# Patient Record
Sex: Male | Born: 1960 | Race: White | Hispanic: No | Marital: Married | Smoking: Former smoker
Health system: Southern US, Community
[De-identification: ages and names within clinical notes are randomized; demographics above are authoritative.]

## PROBLEM LIST (undated history)

## (undated) DIAGNOSIS — R112 Nausea with vomiting, unspecified: Secondary | ICD-10-CM

## (undated) DIAGNOSIS — T7840XA Allergy, unspecified, initial encounter: Secondary | ICD-10-CM

## (undated) DIAGNOSIS — Z9889 Other specified postprocedural states: Secondary | ICD-10-CM

## (undated) HISTORY — PX: ANTERIOR CRUCIATE LIGAMENT REPAIR: SHX115

## (undated) HISTORY — DX: Allergy, unspecified, initial encounter: T78.40XA

## (undated) HISTORY — PX: OTHER SURGICAL HISTORY: SHX169

---

## 1983-09-12 HISTORY — PX: OTHER SURGICAL HISTORY: SHX169

## 2005-07-21 ENCOUNTER — Encounter: Admission: RE | Admit: 2005-07-21 | Discharge: 2005-07-21 | Payer: Self-pay | Admitting: Family Medicine

## 2006-06-22 IMAGING — CR DG CHEST 2V
2 series · 2 of 2 positions shown · non-contrast
Comparison: none

CLINICAL DATA: Cough. Crackle at left lung base. 
 CHEST ? TWO VIEWS:

[view not recorded (1 of 2)]
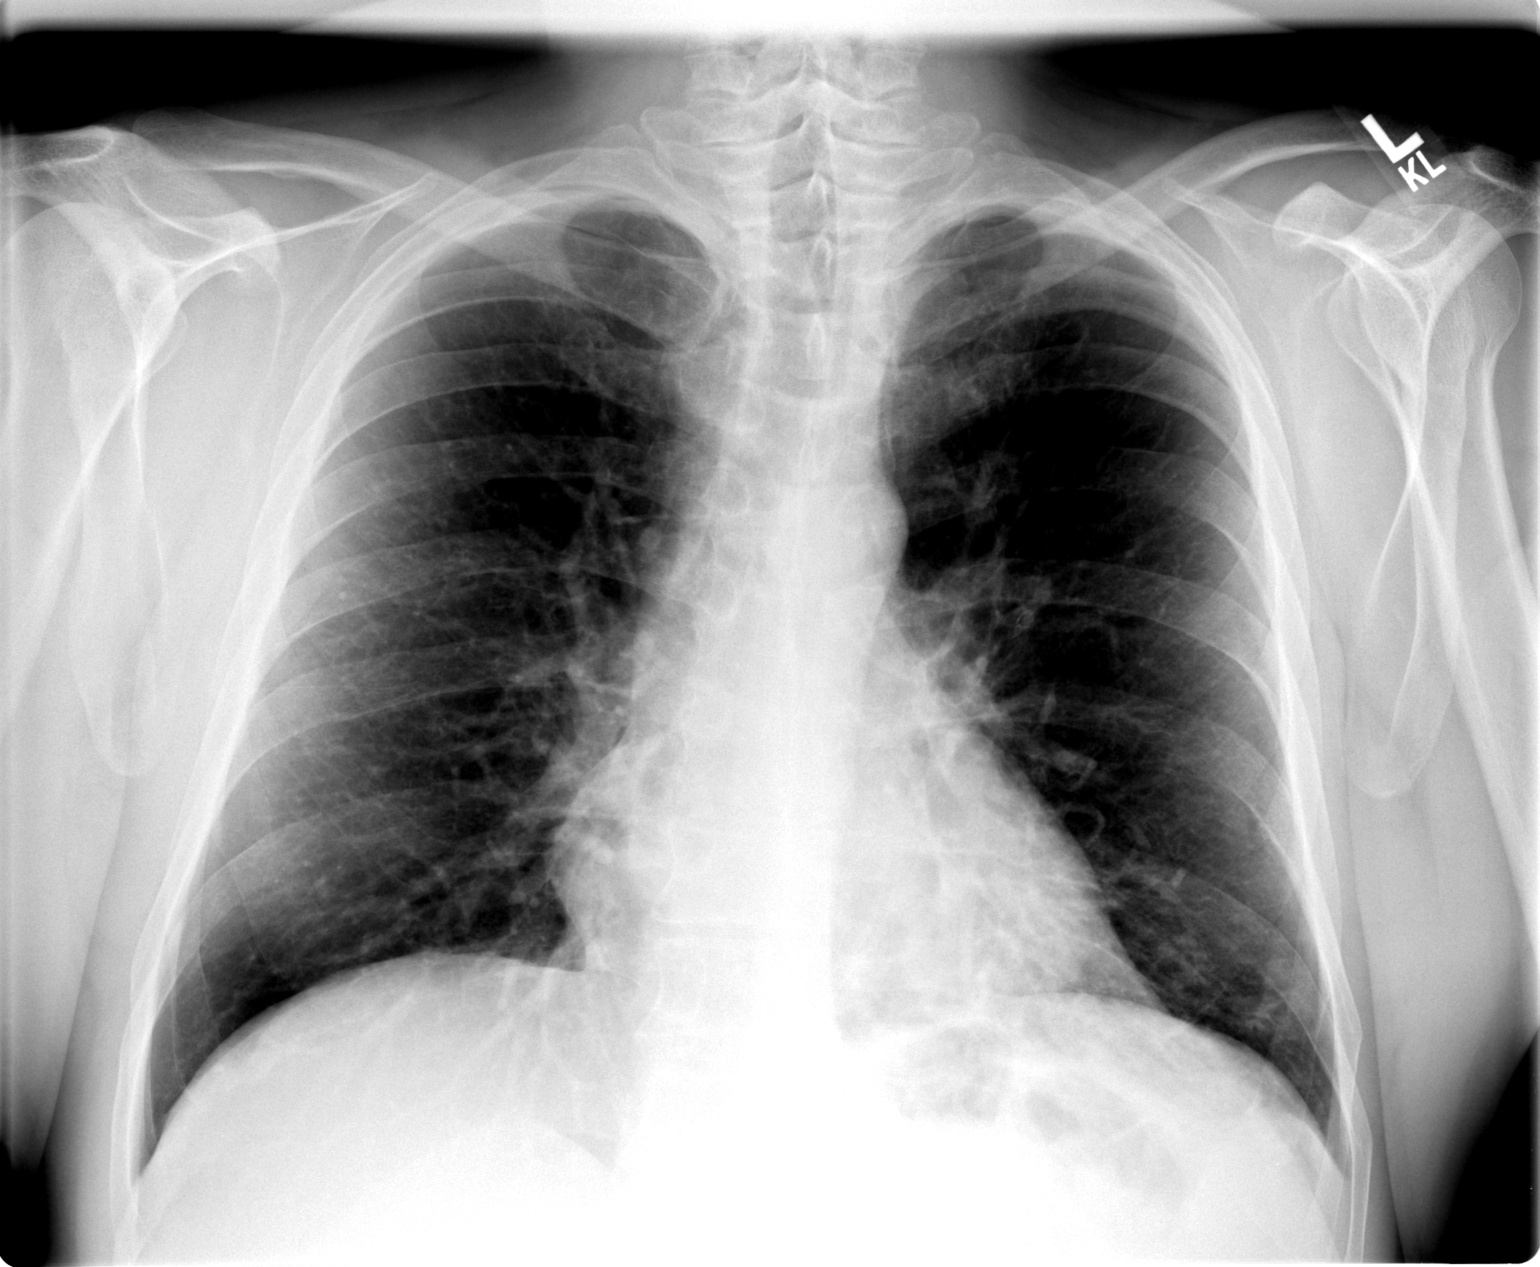

[view not recorded (2 of 2)]
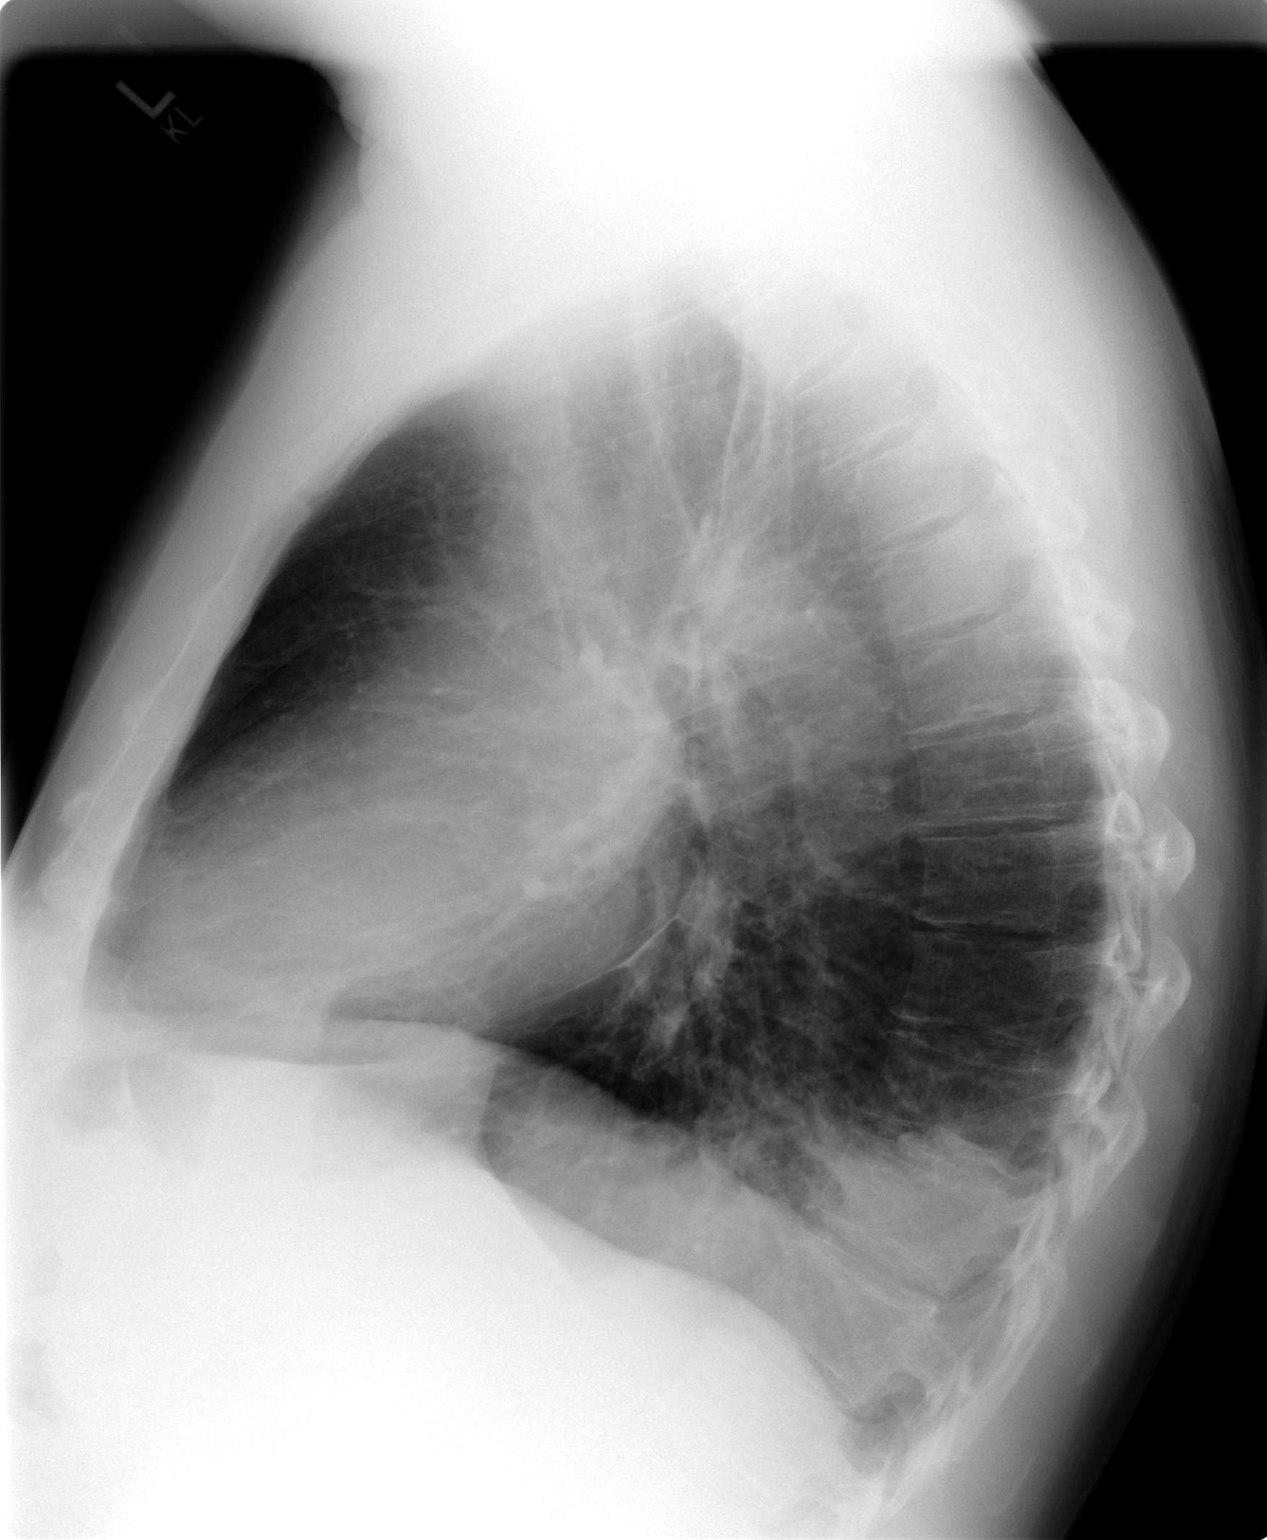

[2 of 2 positions shown; findings below may reference images not displayed]

FINDINGS: Two views of the chest show opacity posteriorly at the left lung base consistent with left lower lobe pneumonia.  The right lung is clear.  The lungs are hyperaerated.  The heart is within upper limits of normal.
IMPRESSION: Left lower lobe opacity consistent with pneumonia.

## 2016-11-21 ENCOUNTER — Encounter: Payer: Self-pay | Admitting: Gastroenterology

## 2017-01-19 ENCOUNTER — Ambulatory Visit (AMBULATORY_SURGERY_CENTER): Payer: Self-pay

## 2017-01-19 ENCOUNTER — Encounter: Payer: Self-pay | Admitting: Gastroenterology

## 2017-01-19 VITALS — Ht 70.0 in | Wt 240.0 lb

## 2017-01-19 DIAGNOSIS — Z1211 Encounter for screening for malignant neoplasm of colon: Secondary | ICD-10-CM

## 2017-01-19 MED ORDER — NA SULFATE-K SULFATE-MG SULF 17.5-3.13-1.6 GM/177ML PO SOLN: 1.0000 | Freq: Once | ORAL | 0 refills | Status: AC

## 2017-01-19 NOTE — Progress Notes (Signed)
Denies allergies to eggs or soy products. Denies complication of anesthesia or sedation. Denies use of weight loss medication. Denies use of O2.   Emmi instructions given for colonoscopy.  

## 2017-02-02 ENCOUNTER — Ambulatory Visit (AMBULATORY_SURGERY_CENTER): Payer: Managed Care, Other (non HMO) | Admitting: Gastroenterology

## 2017-02-02 ENCOUNTER — Encounter: Payer: Self-pay | Admitting: Gastroenterology

## 2017-02-02 VITALS — BP 124/82 | HR 67 | Temp 98.9°F | Resp 17 | Ht 70.0 in | Wt 240.0 lb

## 2017-02-02 DIAGNOSIS — Z1212 Encounter for screening for malignant neoplasm of rectum: Secondary | ICD-10-CM | POA: Diagnosis not present

## 2017-02-02 DIAGNOSIS — K635 Polyp of colon: Secondary | ICD-10-CM

## 2017-02-02 DIAGNOSIS — Z1211 Encounter for screening for malignant neoplasm of colon: Secondary | ICD-10-CM

## 2017-02-02 DIAGNOSIS — D122 Benign neoplasm of ascending colon: Secondary | ICD-10-CM | POA: Diagnosis not present

## 2017-02-02 DIAGNOSIS — D123 Benign neoplasm of transverse colon: Secondary | ICD-10-CM

## 2017-02-02 DIAGNOSIS — D125 Benign neoplasm of sigmoid colon: Secondary | ICD-10-CM | POA: Diagnosis not present

## 2017-02-02 HISTORY — PX: COLONOSCOPY: SHX174

## 2017-02-02 MED ORDER — SODIUM CHLORIDE 0.9 % IV SOLN: 500.0000 mL | INTRAVENOUS | Status: DC

## 2017-02-02 NOTE — Progress Notes (Signed)
Called to room to assist during endoscopic procedure.  Patient ID and intended procedure confirmed with present staff. Received instructions for my participation in the procedure from the performing physician.  

## 2017-02-02 NOTE — Op Note (Signed)
[place] Patient Name: Sean Edwards Procedure Date: 02/02/2017 9:29 AM MRN: [id number] Endoscopist: Ladene Artist , MD Age: 56 Referring MD:  Date of Birth: [birth year] Gender: Male Account #: [id number] Procedure:                Colonoscopy Indications:              Screening for colorectal malignant neoplasm Medicines:                Monitored Anesthesia Care Procedure:                Pre-Anesthesia Assessment:                           - Prior to the procedure, a History and Physical                            was performed, and patient medications and                            allergies were reviewed. The patient's tolerance of                            previous anesthesia was also reviewed. The risks                            and benefits of the procedure and the sedation                            options and risks were discussed with the patient.                            All questions were answered, and informed consent                            was obtained. Prior Anticoagulants: The patient has                            taken no previous anticoagulant or antiplatelet                            agents. ASA Grade Assessment: II - A patient with                            mild systemic disease. After reviewing the risks                            and benefits, the patient was deemed in                            satisfactory condition to undergo the procedure.                           After obtaining informed consent, the colonoscope  was passed under direct vision. Throughout the                            procedure, the patient's blood pressure, pulse, and                            oxygen saturations were monitored continuously. The                            Colonoscope was introduced through the anus and                            advanced to the the cecum, identified by                            appendiceal orifice and  ileocecal valve. The                            ileocecal valve, appendiceal orifice, and rectum                            were photographed. The quality of the bowel                            preparation was good. The colonoscopy was performed                            without difficulty. The patient tolerated the                            procedure well. Scope In: 9:36:26 AM Scope Out: 9:51:49 AM Scope Withdrawal Time: 0 hours 13 minutes 39 seconds  Total Procedure Duration: 0 hours 15 minutes 23 seconds  Findings:                 The perianal and digital rectal examinations were                            normal.                           A 6 mm polyp was found in the sigmoid colon. The                            polyp was sessile. The polyp was removed with a                            cold snare. Resection and retrieval were complete.                           Three sessile polyps were found in the transverse                            colon (1) and ascending colon (2). The polyps were  3 to 4 mm in size. These polyps were removed with a                            cold biopsy forceps. Resection and retrieval were                            complete.                           Internal hemorrhoids were found during                            retroflexion. The hemorrhoids were small and Grade                            I (internal hemorrhoids that do not prolapse).                           The exam was otherwise without abnormality on                            direct and retroflexion views. Complications:            No immediate complications. Estimated blood loss:                            None. Estimated Blood Loss:     Estimated blood loss: none. Impression:               - One 6 mm polyp in the sigmoid colon, removed with                            a cold snare. Resected and retrieved.                           - Three 3 to 4 mm polyps in the  transverse colon                            and in the ascending colon, removed with a cold                            biopsy forceps. Resected and retrieved.                           - Internal hemorrhoids.                           - The examination was otherwise normal on direct                            and retroflexion views. Recommendation:           - Repeat colonoscopy in 5 years for surveillance if                            polyp(s)  are precancerous.                           - Patient has a contact number available for                            emergencies. The signs and symptoms of potential                            delayed complications were discussed with the                            patient. Return to normal activities tomorrow.                            Written discharge instructions were provided to the                            patient.                           - Resume previous diet.                           - Continue present medications.                           - Await pathology results. Ladene Artist, MD 02/02/2017 9:58:23 AM This report has been signed electronically.

## 2017-02-02 NOTE — Progress Notes (Signed)
Pt's states no medical or surgical changes since previsit or office visit. 

## 2017-02-02 NOTE — Patient Instructions (Signed)
YOU HAD AN ENDOSCOPIC PROCEDURE TODAY AT THE [facility]:   Refer to the procedure report that was given to you for any specific questions about what was found during the examination.  If the procedure report does not answer your questions, please call your gastroenterologist to clarify.  If you requested that your care partner not be given the details of your procedure findings, then the procedure report has been included in a sealed envelope for you to review at your convenience later.  YOU SHOULD EXPECT: Some feelings of bloating in the abdomen. Passage of more gas than usual.  Walking can help get rid of the air that was put into your GI tract during the procedure and reduce the bloating. If you had a lower endoscopy (such as a colonoscopy or flexible sigmoidoscopy) you may notice spotting of blood in your stool or on the toilet paper. If you underwent a bowel prep for your procedure, you may not have a normal bowel movement for a few days.  Please Note:  You might notice some irritation and congestion in your nose or some drainage.  This is from the oxygen used during your procedure.  There is no need for concern and it should clear up in a day or so.  SYMPTOMS TO REPORT IMMEDIATELY:   Following lower endoscopy (colonoscopy or flexible sigmoidoscopy):  Excessive amounts of blood in the stool  Significant tenderness or worsening of abdominal pains  Swelling of the abdomen that is new, acute  Fever of 100F or higher  For urgent or emergent issues, a gastroenterologist can be reached at any hour by calling [phone].   DIET:  We do recommend a small meal at first, but then you may proceed to your regular diet.  Drink plenty of fluids but you should avoid alcoholic beverages for 24 hours.  MEDICATIONS: Continue present medications.  Please see handouts given to you by your recovery nurse.  ACTIVITY:  You should plan to take it easy for the rest of today and you should NOT  DRIVE or use heavy machinery until tomorrow (because of the sedation medicines used during the test).    FOLLOW UP: Our staff will call the number listed on your records the next business day following your procedure to check on you and address any questions or concerns that you may have regarding the information given to you following your procedure. If we do not reach you, we will leave a message.  However, if you are feeling well and you are not experiencing any problems, there is no need to return our call.  We will assume that you have returned to your regular daily activities without incident.  If any biopsies were taken you will be contacted by phone or by letter within the next 1-3 weeks.  Please call us at [phone] if you have not heard about the biopsies in 3 weeks.   Thank you for allowing us to provide for your healthcare needs today.   SIGNATURES/CONFIDENTIALITY: You and/or your care partner have signed paperwork which will be entered into your electronic medical record.  These signatures attest to the fact that that the information above on your After Visit Summary has been reviewed and is understood.  Full responsibility of the confidentiality of this discharge information lies with you and/or your care-partner. 

## 2017-02-02 NOTE — Progress Notes (Signed)
Report to PACU, RN, vss, BBS= Clear.  

## 2017-02-06 ENCOUNTER — Telehealth: Payer: Self-pay

## 2017-02-06 NOTE — Telephone Encounter (Signed)
  Follow up Call-  Call back number 02/02/2017  Post procedure Call Back phone  # [phone]  Permission to leave phone message Yes  Some recent data might be hidden     Patient questions:  Do you have a fever, pain , or abdominal swelling? No. Pain Score  0 *  Have you tolerated food without any problems? Yes.    Have you been able to return to your normal activities? Yes.    Do you have any questions about your discharge instructions: Diet   No. Medications  No. Follow up visit  No.  Do you have questions or concerns about your Care? No.  Actions: * If pain score is 4 or above: No action needed, pain <4.

## 2017-02-11 ENCOUNTER — Encounter: Payer: Self-pay | Admitting: Gastroenterology

## 2022-02-17 ENCOUNTER — Encounter: Payer: Self-pay | Admitting: Gastroenterology

## 2022-05-18 ENCOUNTER — Ambulatory Visit: Payer: 59 | Admitting: *Deleted

## 2022-05-18 VITALS — Ht 70.0 in | Wt 215.4 lb

## 2022-05-18 DIAGNOSIS — Z86010 Personal history of colonic polyps: Secondary | ICD-10-CM

## 2022-05-18 MED ORDER — NA SULFATE-K SULFATE-MG SULF 17.5-3.13-1.6 GM/177ML PO SOLN: 1.0000 | Freq: Once | ORAL | 0 refills | Status: AC

## 2022-05-18 NOTE — Progress Notes (Signed)
No egg or soy allergy known to patient  No issues known to pt with past sedation with any surgeries or procedures Patient denies ever being told they had issues or difficulty with intubation  No FH of Malignant Hyperthermia Pt is not on diet pills Pt is not on  home 02  Pt is not on blood thinners  Pt denies issues with constipation  No A fib or A flutter Have any cardiac testing pending--no Pt instructed to use Single[email or web address] or GoodRx for a price reduction on prep   

## 2022-06-15 ENCOUNTER — Ambulatory Visit (AMBULATORY_SURGERY_CENTER): Payer: 59 | Admitting: Gastroenterology

## 2022-06-15 ENCOUNTER — Encounter: Payer: Self-pay | Admitting: Gastroenterology

## 2022-06-15 VITALS — BP 134/85 | HR 72 | Temp 98.2°F | Resp 12 | Ht 70.0 in | Wt 215.4 lb

## 2022-06-15 DIAGNOSIS — Z86010 Personal history of colonic polyps: Secondary | ICD-10-CM

## 2022-06-15 DIAGNOSIS — Z09 Encounter for follow-up examination after completed treatment for conditions other than malignant neoplasm: Secondary | ICD-10-CM

## 2022-06-15 DIAGNOSIS — D124 Benign neoplasm of descending colon: Secondary | ICD-10-CM | POA: Diagnosis not present

## 2022-06-15 MED ORDER — SODIUM CHLORIDE 0.9 % IV SOLN: 500.0000 mL | Freq: Once | INTRAVENOUS | Status: DC

## 2022-06-15 NOTE — Progress Notes (Signed)
Pt's states no medical or surgical changes since previsit or office visit. VS assessed by C.W 

## 2022-06-15 NOTE — Progress Notes (Signed)
A and O x3. Report to RN. Tolerated MAC anesthesia well. 

## 2022-06-15 NOTE — Progress Notes (Signed)
Called to room to assist during endoscopic procedure.  Patient ID and intended procedure confirmed with present staff. Received instructions for my participation in the procedure from the performing physician.  

## 2022-06-15 NOTE — Progress Notes (Signed)
Patient vomited x 3 in recovery.  Gaye Pollack, CRNA gave Zofran.

## 2022-06-15 NOTE — Progress Notes (Signed)
History & Physical  Primary Care Physician:  Pcp, No Primary Gastroenterologist: Lucio Edward, MD  CHIEF COMPLAINT:  Personal history of colon polyps   HPI: Sean Edwards is a 61 y.o. male with a personal history of adenomatous colon polyps for surveillance colonoscopy.   Past Medical History:  Diagnosis Date   Allergy     Past Surgical History:  Procedure Laterality Date   ANTERIOR CRUCIATE LIGAMENT REPAIR Left    COLONOSCOPY  02/02/2017   Lymph node removed  1985   Inflamed lymph node removed.    Tracheotomy due to croup  N/A     Prior to Admission medications   Medication Sig Start Date End Date Taking? Authorizing Provider  cetirizine (ZYRTEC) 10 MG chewable tablet Chew 10 mg by mouth as needed for allergies.   Yes [provider]  fluticasone (FLONASE) 50 MCG/ACT nasal spray Place 1 spray into both nostrils daily.   Yes [provider]  Multiple Vitamins-Minerals (MULTIVITAMIN ADULTS PO) Take by mouth.   Yes [provider]  Multiple Vitamins-Minerals (OCUVITE PO) Take by mouth.   Yes [provider]  naproxen sodium (ANAPROX) 220 MG tablet Take 220 mg by mouth as needed.   Yes [provider]  OVER THE COUNTER MEDICATION Take 1,000 packets by mouth. Emergen-C Packet 2 to 3 times a week. Vitamin supplement.   Yes [provider]  doxylamine, Sleep, (UNISOM) 25 MG tablet Take 25 mg by mouth as needed. Patient not taking: Reported on 06/15/2022    [provider]    Current Outpatient Medications  Medication Sig Dispense Refill   cetirizine (ZYRTEC) 10 MG chewable tablet Chew 10 mg by mouth as needed for allergies.     fluticasone (FLONASE) 50 MCG/ACT nasal spray Place 1 spray into both nostrils daily.     Multiple Vitamins-Minerals (MULTIVITAMIN ADULTS PO) Take by mouth.     Multiple Vitamins-Minerals (OCUVITE PO) Take by mouth.     naproxen sodium (ANAPROX) 220 MG tablet Take 220 mg by mouth as  needed.     OVER THE COUNTER MEDICATION Take 1,000 packets by mouth. Emergen-C Packet 2 to 3 times a week. Vitamin supplement.     doxylamine, Sleep, (UNISOM) 25 MG tablet Take 25 mg by mouth as needed. (Patient not taking: Reported on 06/15/2022)     Current Facility-Administered Medications  Medication Dose Route Frequency Provider Last Rate Last Admin   0.9 %  sodium chloride infusion  500 mL Intravenous Continuous Ladene Artist, MD       0.9 %  sodium chloride infusion  500 mL Intravenous Once Ladene Artist, MD        Allergies as of 06/15/2022   (No Active Allergies)    Family History  Problem Relation Age of Onset   Colon cancer Neg Hx    Esophageal cancer Neg Hx    Rectal cancer Neg Hx    Stomach cancer Neg Hx     Social History   Socioeconomic History   Marital status: Married    Spouse name: Not on file   Number of children: Not on file   Years of education: Not on file   Highest education level: Not on file  Occupational History   Not on file  Tobacco Use   Smoking status: Former   Smokeless tobacco: Former    Types: Chew   Tobacco comments:    Quit over 15 years ago.  Substance and Sexual Activity   Alcohol  use: Yes    Comment: occasional beer   Drug use: No   Sexual activity: Not on file  Other Topics Concern   Not on file  Social History Narrative   Not on file   Social Determinants of Health   Financial Resource Strain: Not on file  Food Insecurity: Not on file  Transportation Needs: Not on file  Physical Activity: Not on file  Stress: Not on file  Social Connections: Not on file  Intimate Partner Violence: Not on file    Review of Systems:  All systems reviewed were negative except where noted in HPI.   Physical Exam: General:  Alert, well-developed, in NAD Head:  Normocephalic and atraumatic. Eyes:  Sclera clear, no icterus.   Conjunctiva pink. Ears:  Normal auditory acuity. Mouth:  No deformity or lesions.  Neck:  Supple; no  masses . Lungs:  Clear throughout to auscultation.   No wheezes, crackles, or rhonchi. No acute distress. Heart:  Regular rate and rhythm; no murmurs. Abdomen:  Soft, nondistended, nontender. No masses, hepatomegaly. No obvious masses.  Normal bowel .    Rectal:  Deferred   Msk:  Symmetrical without gross deformities.. Pulses:  Normal pulses noted. Extremities:  Without edema. Neurologic:  Alert and  oriented x4;  grossly normal neurologically. Skin:  Intact without significant lesions or rashes. Cervical Nodes:  No significant cervical adenopathy. Psych:  Alert and cooperative. Normal mood and affect.   Impression / Edwards:   Personal history of adenomatous colon polyps for surveillance colonoscopy.  Sean Edwards. Sean Edwards  06/15/2022, 8:37 AM See Shea Evans, [facility], to contact our on call provider

## 2022-06-15 NOTE — Op Note (Signed)
[facility] Patient Name: Demetrias Goodbar Procedure Date: 06/15/2022 8:31 AM MRN: [id number] Endoscopist: Ladene Artist , MD Age: 61 Referring MD:  Date of Birth: [birth year] Gender: Male Account #: [id number] Procedure:                Colonoscopy Indications:              Surveillance: Personal history of adenomatous                            polyps on last colonoscopy 5 years ago Medicines:                Monitored Anesthesia Care Procedure:                Pre-Anesthesia Assessment:                           - Prior to the procedure, a History and Physical                            was performed, and patient medications and                            allergies were reviewed. The patient's tolerance of                            previous anesthesia was also reviewed. The risks                            and benefits of the procedure and the sedation                            options and risks were discussed with the patient.                            All questions were answered, and informed consent                            was obtained. Prior Anticoagulants: The patient has                            taken no previous anticoagulant or antiplatelet                            agents. ASA Grade Assessment: II - A patient with                            mild systemic disease. After reviewing the risks                            and benefits, the patient was deemed in                            satisfactory condition to undergo the procedure.  After obtaining informed consent, the colonoscope                            was passed under direct vision. Throughout the                            procedure, the patient's blood pressure, pulse, and                            oxygen saturations were monitored continuously. The                            Olympus CF-HQ190L [id number]) Colonoscope was                            introduced through the anus and  advanced to the the                            cecum, identified by appendiceal orifice and                            ileocecal valve. The ileocecal valve, appendiceal                            orifice, and rectum were photographed. The quality                            of the bowel preparation was good. The colonoscopy                            was performed without difficulty. The patient                            tolerated the procedure well. Scope In: 8:39:24 AM Scope Out: 8:53:35 AM Scope Withdrawal Time: 0 hours 12 minutes 21 seconds  Total Procedure Duration: 0 hours 14 minutes 11 seconds  Findings:                 The perianal and digital rectal examinations were                            normal.                           A 6 mm polyp was found in the descending colon. The                            polyp was sessile. The polyp was removed with a                            cold snare. Resection and retrieval were complete.                           Internal hemorrhoids were found during  retroflexion. The hemorrhoids were small and Grade                            I (internal hemorrhoids that do not prolapse).                           The exam was otherwise without abnormality on                            direct and retroflexion views. Complications:            No immediate complications. Estimated blood loss:                            None. Estimated Blood Loss:     Estimated blood loss: none. Impression:               - One 6 mm polyp in the descending colon, removed                            with a cold snare. Resected and retrieved.                           - Internal hemorrhoids.                           - The examination was otherwise normal on direct                            and retroflexion views. Recommendation:           - Repeat colonoscopy after studies are complete for                            surveillance based on pathology  results.                           - Patient has a contact number available for                            emergencies. The signs and symptoms of potential                            delayed complications were discussed with the                            patient. Return to normal activities tomorrow.                            Written discharge instructions were provided to the                            patient.                           - Resume previous diet.                           -  Continue present medications.                           - Await pathology results. Ladene Artist, MD 06/15/2022 8:56:27 AM This report has been signed electronically.

## 2022-06-15 NOTE — Patient Instructions (Signed)
Information on polyps given to you today.  Await pathology results.  Resume previous diet and medications.    YOU HAD AN ENDOSCOPIC PROCEDURE TODAY AT THE [facility]:   Refer to the procedure report that was given to you for any specific questions about what was found during the examination.  If the procedure report does not answer your questions, please call your gastroenterologist to clarify.  If you requested that your care partner not be given the details of your procedure findings, then the procedure report has been included in a sealed envelope for you to review at your convenience later.  YOU SHOULD EXPECT: Some feelings of bloating in the abdomen. Passage of more gas than usual.  Walking can help get rid of the air that was put into your GI tract during the procedure and reduce the bloating. If you had a lower endoscopy (such as a colonoscopy or flexible sigmoidoscopy) you may notice spotting of blood in your stool or on the toilet paper. If you underwent a bowel prep for your procedure, you may not have a normal bowel movement for a few days.  Please Note:  You might notice some irritation and congestion in your nose or some drainage.  This is from the oxygen used during your procedure.  There is no need for concern and it should clear up in a day or so.  SYMPTOMS TO REPORT IMMEDIATELY:  Following lower endoscopy (colonoscopy or flexible sigmoidoscopy):  Excessive amounts of blood in the stool  Significant tenderness or worsening of abdominal pains  Swelling of the abdomen that is new, acute  Fever of 100F or higher  For urgent or emergent issues, a gastroenterologist can be reached at any hour by calling [phone]. Do not use MyChart messaging for urgent concerns.    DIET:  We do recommend a small meal at first, but then you may proceed to your regular diet.  Drink plenty of fluids but you should avoid alcoholic beverages for 24 hours.  ACTIVITY:  You should  plan to take it easy for the rest of today and you should NOT DRIVE or use heavy machinery until tomorrow (because of the sedation medicines used during the test).    FOLLOW UP: Our staff will call the number listed on your records the next business day following your procedure.  We will call around 7:15- 8:00 am to check on you and address any questions or concerns that you may have regarding the information given to you following your procedure. If we do not reach you, we will leave a message.     If any biopsies were taken you will be contacted by phone or by letter within the next 1-3 weeks.  Please call us at [phone] if you have not heard about the biopsies in 3 weeks.    SIGNATURES/CONFIDENTIALITY: You and/or your care partner have signed paperwork which will be entered into your electronic medical record.  These signatures attest to the fact that that the information above on your After Visit Summary has been reviewed and is understood.  Full responsibility of the confidentiality of this discharge information lies with you and/or your care-partner.  

## 2022-06-16 ENCOUNTER — Telehealth: Payer: Self-pay

## 2022-06-16 NOTE — Telephone Encounter (Signed)
  Follow up Call-     06/15/2022    7:23 AM  Call back number  Post procedure Call Back phone  # [phone]  Permission to leave phone message Yes     Patient questions:  Do you have a fever, pain , or abdominal swelling? No. Pain Score  0 *  Have you tolerated food without any problems? Yes.    Have you been able to return to your normal activities? Yes.    Do you have any questions about your discharge instructions: Diet   No. Medications  No. Follow up visit  No.  Do you have questions or concerns about your Care? No.  Actions: * If pain score is 4 or above: No action needed, pain <4.

## 2022-07-04 ENCOUNTER — Encounter: Payer: Self-pay | Admitting: Gastroenterology

## 2023-04-05 ENCOUNTER — Other Ambulatory Visit: Payer: Self-pay | Admitting: Urology

## 2023-04-05 ENCOUNTER — Encounter (HOSPITAL_BASED_OUTPATIENT_CLINIC_OR_DEPARTMENT_OTHER): Payer: Self-pay | Admitting: Urology

## 2023-04-05 NOTE — Progress Notes (Signed)
   04/05/23 1649  Pre-op Phone Call  Surgery Date Verified 04/06/23  Arrival Time Verified 1145  Surgery Location Verified [facility]  Medical History Reviewed Yes  Is the patient taking a GLP-1 receptor agonist? No  Does the patient have diabetes? No diagnosis of diabetes  Do you have a history of heart problems? No  Does patient have other implanted devices? No  Patient Teaching Pre / Post Procedure  Patient educated about smoking cessation 24 hours prior to surgery. N/A Non-Smoker  Patient verbalizes understanding of bowel prep? Yes  NPO (Including gum & candy) After midnight  Allowed clear liquids Water;Black Coffee Only (no creamer, milk or cream including half and half);Juice (not-citric and without pulp - diabetics please choose diet or no sugar options);Carbonated beverages - (diabetics please choose diet or no sugar options)  Stop Solids, Milk, Candy, and Gum STARTING AT MIDNIGHT  Responsible adult to drive and be with you for 24 hours? Yes  Name & Phone Number for Ride/Caregiver Chyrl Civatte (wife) [phone]  Call this number the morning of surgery  with any problems that may cancel your surgery. [phone]  Covid-19 Assessment  Have you had a positive COVID-19 test within the previous 90 days? No  COVID Testing Guidance Proceed with the additional questions.  Patient's surgery required a COVID-19 test (cardiothoracic, complex ENT, and bronchoscopies/ EBUS) No  Have you been unmasked and in close contact with anyone with COVID-19 or COVID-19 symptoms within the past 10 days? No  Do you or anyone in your household currently have any COVID-19 symptoms? No

## 2023-04-05 NOTE — Progress Notes (Signed)
Spoke with patient concerning arrival time 1145, NPO past MN, clear liquids until 0730. Wife Chyrl Civatte to be caregiver and ride. Pt took Naproxen this am, MD aware and okay with that. Instructed to take no further NSAIDs today.

## 2023-04-05 NOTE — H&P (Signed)
Office Visit Report     04/05/2023   --------------------------------------------------------------------------------   Sean Edwards  MRN: [id number]  DOB: 22-Sep-1960, 62 year old Male  SSN:    PRIMARY CARE:     REFERRING:    PROVIDER:  Jerilee Field, M.D.  LOCATION:  [facility] [phone]     --------------------------------------------------------------------------------   CC/HPI: New pt -   1) right distal stone - flank pain while traveling in [place], [place]. CT 03/27/2023 with a 6 mm right distal stone. UA negative. Cr 1.6. wbc 16. He hasn't seen a stone pass. His pain has improved. Minimal today. Gets RLQ pain. Oxycodone makes him feel agitated and nauseated. Hydrocodone better. Not on tamsulosin.   2) renal stones - he passed a stone around 2017. A CT 03/27/2023 with a few nonobstructing bilateral renal stones measuring up to 5 mm on the right and 6 mm on the left.   He is AFVSS. KUB today - bilateral 6 mm lower pole stones, 6 mm right distal stone at right UVJ.   No dysuria or fever.   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     ALLERGIES: No Allergies    MEDICATIONS: Aleve 1 PO Daily     GU PSH: None   NON-GU PSH: Knee Arthroscopy/surgery - 1999     GU PMH: Renal calculus    NON-GU PMH: None   FAMILY HISTORY: kidney stone - Mother   SOCIAL HISTORY: Marital Status: Married Current Smoking Status: Patient does not smoke anymore. Has not smoked since 03/12/2003. Smoked for 10 years.   Tobacco Use Assessment Completed: Used Tobacco in last 30 days? Drinks 1 drink per week.     REVIEW OF SYSTEMS:    GU Review Male:   Patient reports frequent urination and get up at night to urinate. Patient denies hard to postpone urination, burning/ pain with urination, leakage of urine, stream starts and stops, trouble starting your stream, have to strain to urinate , erection problems, and penile pain.   Gastrointestinal (Upper):   Patient denies nausea, vomiting, and indigestion/ heartburn.  Gastrointestinal (Lower):   Patient denies diarrhea and constipation.  Constitutional:   Patient denies fever, night sweats, weight loss, and fatigue.  Skin:   Patient denies skin rash/ lesion and itching.  Eyes:   Patient denies blurred vision and double vision.  Ears/ Nose/ Throat:   Patient denies sore throat and sinus problems.  Hematologic/Lymphatic:   Patient denies swollen glands and easy bruising.  Cardiovascular:   Patient denies leg swelling and chest pains.  Respiratory:   Patient denies cough and shortness of breath.  Endocrine:   Patient denies excessive thirst.  Musculoskeletal:   Patient denies joint pain and back pain.  Neurological:   Patient reports headaches. Patient denies dizziness.  Psychologic:   Patient denies depression and anxiety.   VITAL SIGNS:      04/05/2023 01:16 PM  Weight 215 lb / 97.52 kg  Height 71 in / 180.34 cm  BP 155/83 mmHg  Pulse 76 /min  Temperature 97.3 F / 36.2 C  BMI 30.0 kg/m   MULTI-SYSTEM PHYSICAL EXAMINATION:    Constitutional: Well-nourished. No physical deformities. Normally developed. Good grooming.  Neck: Neck symmetrical, not swollen. Normal tracheal position.  Respiratory: No labored breathing, no use of accessory muscles.   Cardiovascular: Normal temperature, normal extremity pulses, no swelling, no varicosities.  Lymphatic: No enlargement of neck, axillae, groin.  Skin: No paleness, no jaundice, no cyanosis. No lesion, no ulcer, no rash.  Neurologic / Psychiatric: Oriented to time, oriented to place, oriented to person. No depression, no anxiety, no agitation.  Gastrointestinal: No mass, no tenderness, no rigidity, non obese abdomen.  Eyes: Normal conjunctivae. Normal eyelids.  Ears, Nose, Mouth, and Throat: Left ear no scars, no lesions, no masses. Right ear no scars, no lesions, no masses. Nose no scars, no lesions, no masses. Normal  hearing. Normal lips.  Musculoskeletal: Normal gait and station of head and neck.     Complexity of Data:  Lab Test Review:   BMP, CBC with Diff  Records Review:   Previous Hospital Records  X-Ray Review: C.T. Abdomen/Pelvis: Reviewed Report. Discussed With Patient. 2024   Notes:                     [facility]    PROCEDURES:         KUB - [id number]  A single view of the abdomen is obtained.  Calculi:  bilateral 6 mm lower pole stones, 6 mm right distal stone at right UVJ.       The bones appeared normal. The bowel gas pattern appeared normal. The soft tissues were unremarkable. . Patient confirmed No Neulasta OnPro Device.     ASSESSMENT:      ICD-10 Details  1 GU:   Ureteral calculus - N20.1 Chronic, Stable - I discussed with the patient the nature risks and benefits of continued stone passage, off label use of alpha blockers, shockwave lithotripsy or ureteroscopy. All questions answered. He will proceed with right ESWL.   2   Renal calculus - N20.0 Chronic, Stable   PLAN:           Orders X-Rays: KUB          Schedule Return Visit/Planned Activity: ASAP - Schedule Surgery          Document Letter(s):  Created for Patient: Clinical Summary         Next Appointment:      Next Appointment: 04/06/2023 01:45 PM    Appointment Type: Surgery     Location: [facility] [phone] [id number]    Provider: Jerilee Field, M.D.    Reason for Visit: OP NE RT ESWL      * Signed by Jerilee Field, M.D. on 04/05/23 at 2:49 PM (EDT*      The information contained in this medical record document is considered private and confidential patient information. This information can only be used for the medical diagnosis and/or medical services that are being provided by the patient's selected caregivers. This information can only be distributed outside of the patient's care if the patient agrees and signs waivers of authorization for this information to be sent to an  outside source or route.

## 2023-04-06 ENCOUNTER — Ambulatory Visit (HOSPITAL_COMMUNITY): Payer: 59

## 2023-04-06 ENCOUNTER — Encounter (HOSPITAL_BASED_OUTPATIENT_CLINIC_OR_DEPARTMENT_OTHER)
Admission: RE | Disposition: A | Discharge status: Home / Self Care | Payer: Self-pay | Source: Home / Self Care | Attending: Urology

## 2023-04-06 ENCOUNTER — Other Ambulatory Visit: Payer: Self-pay

## 2023-04-06 ENCOUNTER — Ambulatory Visit (HOSPITAL_BASED_OUTPATIENT_CLINIC_OR_DEPARTMENT_OTHER)
Admission: RE | Admit: 2023-04-06 | Discharge: 2023-04-06 | Disposition: A | Discharge status: Home / Self Care | Payer: 59 | Attending: Urology | Admitting: Urology

## 2023-04-06 ENCOUNTER — Encounter (HOSPITAL_BASED_OUTPATIENT_CLINIC_OR_DEPARTMENT_OTHER): Payer: Self-pay | Admitting: Urology

## 2023-04-06 DIAGNOSIS — Z87891 Personal history of nicotine dependence: Secondary | ICD-10-CM | POA: Insufficient documentation

## 2023-04-06 DIAGNOSIS — N201 Calculus of ureter: Secondary | ICD-10-CM | POA: Diagnosis present

## 2023-04-06 HISTORY — DX: Other specified postprocedural states: Z98.890

## 2023-04-06 HISTORY — DX: Nausea with vomiting, unspecified: R11.2

## 2023-04-06 HISTORY — PX: EXTRACORPOREAL SHOCK WAVE LITHOTRIPSY: SHX1557

## 2023-04-06 SURGERY — LITHOTRIPSY, ESWL
Anesthesia: LOCAL | Laterality: Right

## 2023-04-06 MED ORDER — DIAZEPAM 5 MG PO TABS
10.0000 mg | ORAL_TABLET | ORAL | Status: AC
  Administered 2023-04-06: 10 mg via ORAL

## 2023-04-06 MED ORDER — DIAZEPAM 5 MG PO TABS
ORAL_TABLET | ORAL | Status: AC
  Filled 2023-04-06: qty 2

## 2023-04-06 MED ORDER — DIPHENHYDRAMINE HCL 25 MG PO CAPS
ORAL_CAPSULE | ORAL | Status: AC
  Filled 2023-04-06: qty 1

## 2023-04-06 MED ORDER — CIPROFLOXACIN HCL 500 MG PO TABS
500.0000 mg | ORAL_TABLET | ORAL | Status: AC
  Administered 2023-04-06: 500 mg via ORAL

## 2023-04-06 MED ORDER — SODIUM CHLORIDE 0.9 % IV SOLN
INTRAVENOUS | Status: DC
  Administered 2023-04-06: 1000 mL via INTRAVENOUS

## 2023-04-06 MED ORDER — CIPROFLOXACIN HCL 500 MG PO TABS
ORAL_TABLET | ORAL | Status: AC
  Filled 2023-04-06: qty 1

## 2023-04-06 MED ORDER — TAMSULOSIN HCL 0.4 MG PO CAPS: 0.4000 mg | ORAL_CAPSULE | Freq: Every day | ORAL | 0 refills | Status: AC

## 2023-04-06 MED ORDER — DIPHENHYDRAMINE HCL 25 MG PO CAPS
25.0000 mg | ORAL_CAPSULE | ORAL | Status: AC
  Administered 2023-04-06: 25 mg via ORAL

## 2023-04-06 NOTE — Op Note (Signed)
7 mm right distal stone   Right ESWL  Findings: tolerated well. Excellent targeting of stone. Power ramped up to 5. Faded but did not disappear. He may need a staged procedure if he fails to pass the stone or stone fragments.

## 2023-04-06 NOTE — Discharge Instructions (Signed)

## 2023-04-06 NOTE — Interval H&P Note (Signed)
History and Physical Interval Note:  04/06/2023 2:13 PM  Sean Edwards  has presented today for surgery, with the diagnosis of RIGHT URETERAL STONE.  The various methods of treatment have been discussed with the patient and family. After consideration of risks, benefits and other options for treatment, the patient has consented to  Procedure(s): RIGHT EXTRACORPOREAL SHOCK WAVE LITHOTRIPSY (ESWL) (Right) as a surgical intervention.  The patient's history has been reviewed, patient examined, no change in status, stable for surgery. Office UA was clear. Pt has not passed a stone. No pain. No dysuria. No fever. KUB with 7 mm right distal stone. Renal stones.  I have reviewed the patient's chart and labs.  Questions were answered to the patient's satisfaction.     Jerilee Field

## 2023-04-09 ENCOUNTER — Encounter (HOSPITAL_BASED_OUTPATIENT_CLINIC_OR_DEPARTMENT_OTHER): Payer: Self-pay | Admitting: Urology
